# Patient Record
Sex: Male | Born: 1970 | Race: Black or African American | Hispanic: No | Marital: Married | Smoking: Never smoker
Health system: Southern US, Community
[De-identification: ages and names within clinical notes are randomized; demographics above are authoritative.]

---

## 2012-04-02 ENCOUNTER — Ambulatory Visit: Payer: Self-pay

## 2014-05-19 IMAGING — CR RIGHT HIP - COMPLETE 2+ VIEW
1 series · 2 of 2 positions shown · non-contrast
Comparison: none

REASON FOR EXAM: PAIN WITH ROM
COMMENTS:

PROCEDURE:     MDR - MDR HIP RIGHT COMPLETE  - April 02, 2012 [DATE]
RESULT:     No acute bony or joint abnormality. No evidence of fracture.
Mild degenerative change present.

[Series 1: ap · 0.17mm/px · 2 of 2 slices shown]
[im 1/2]
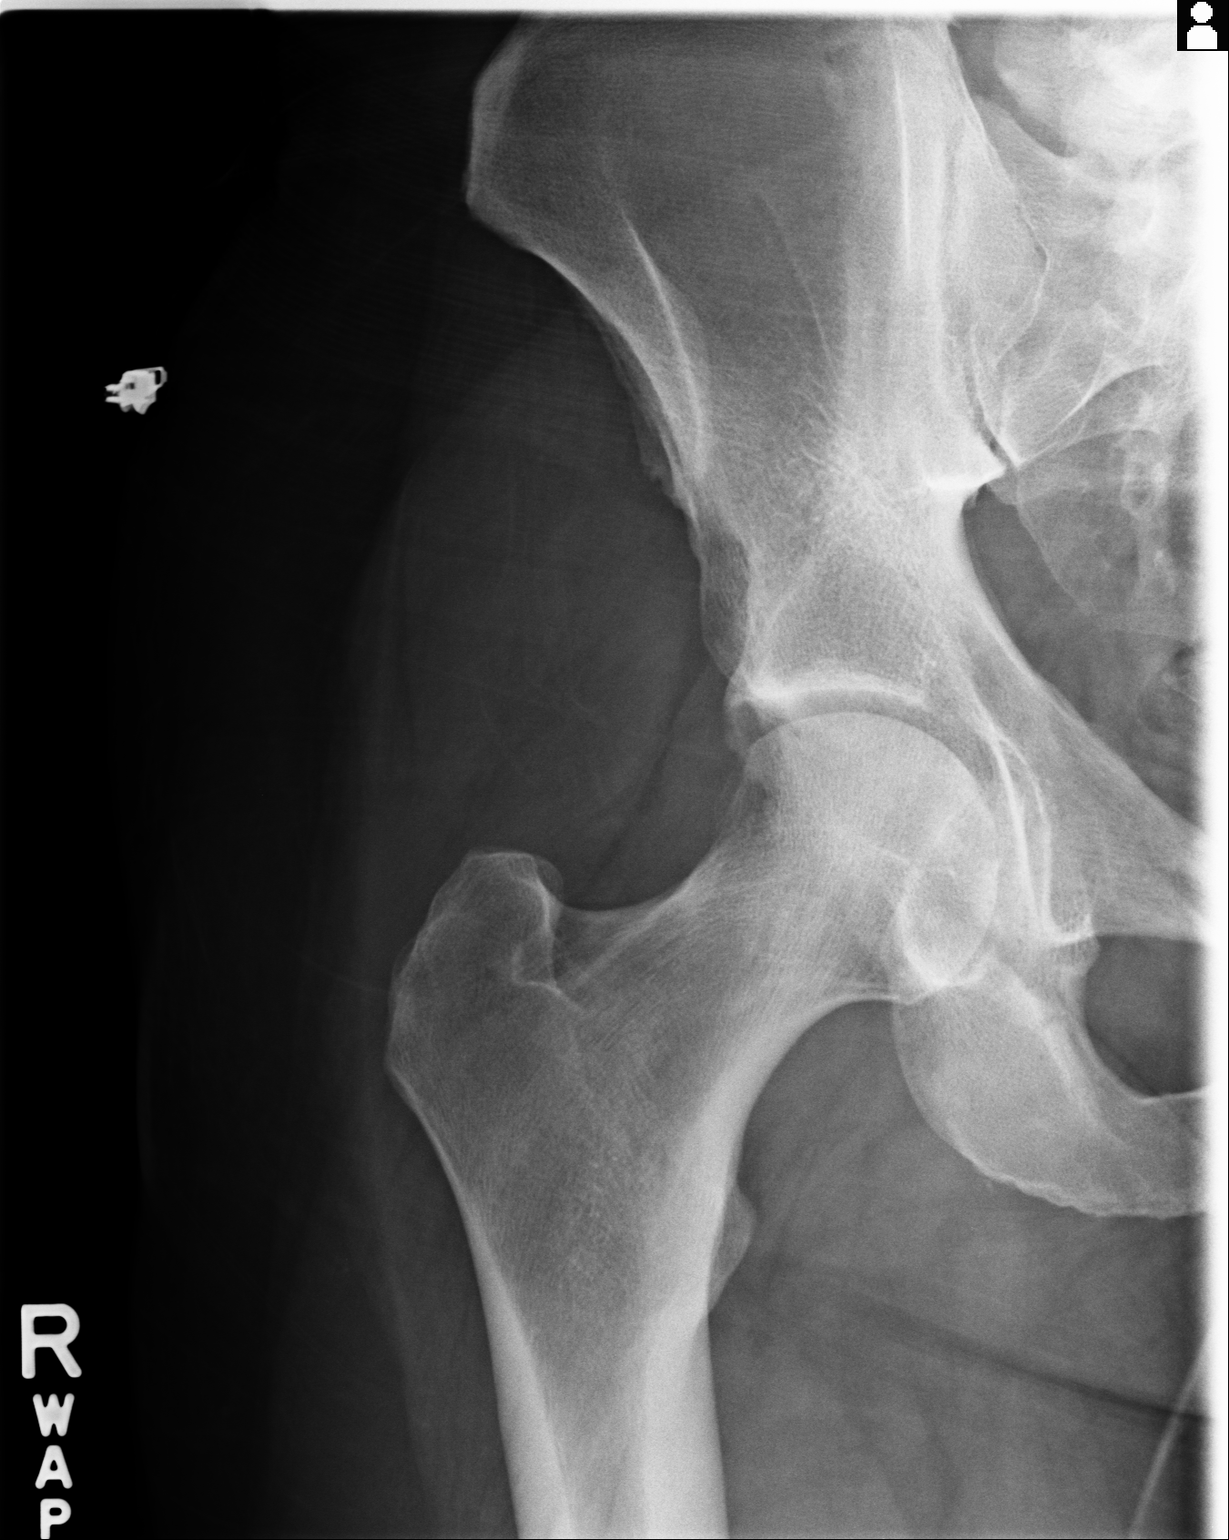
[im 2/2]
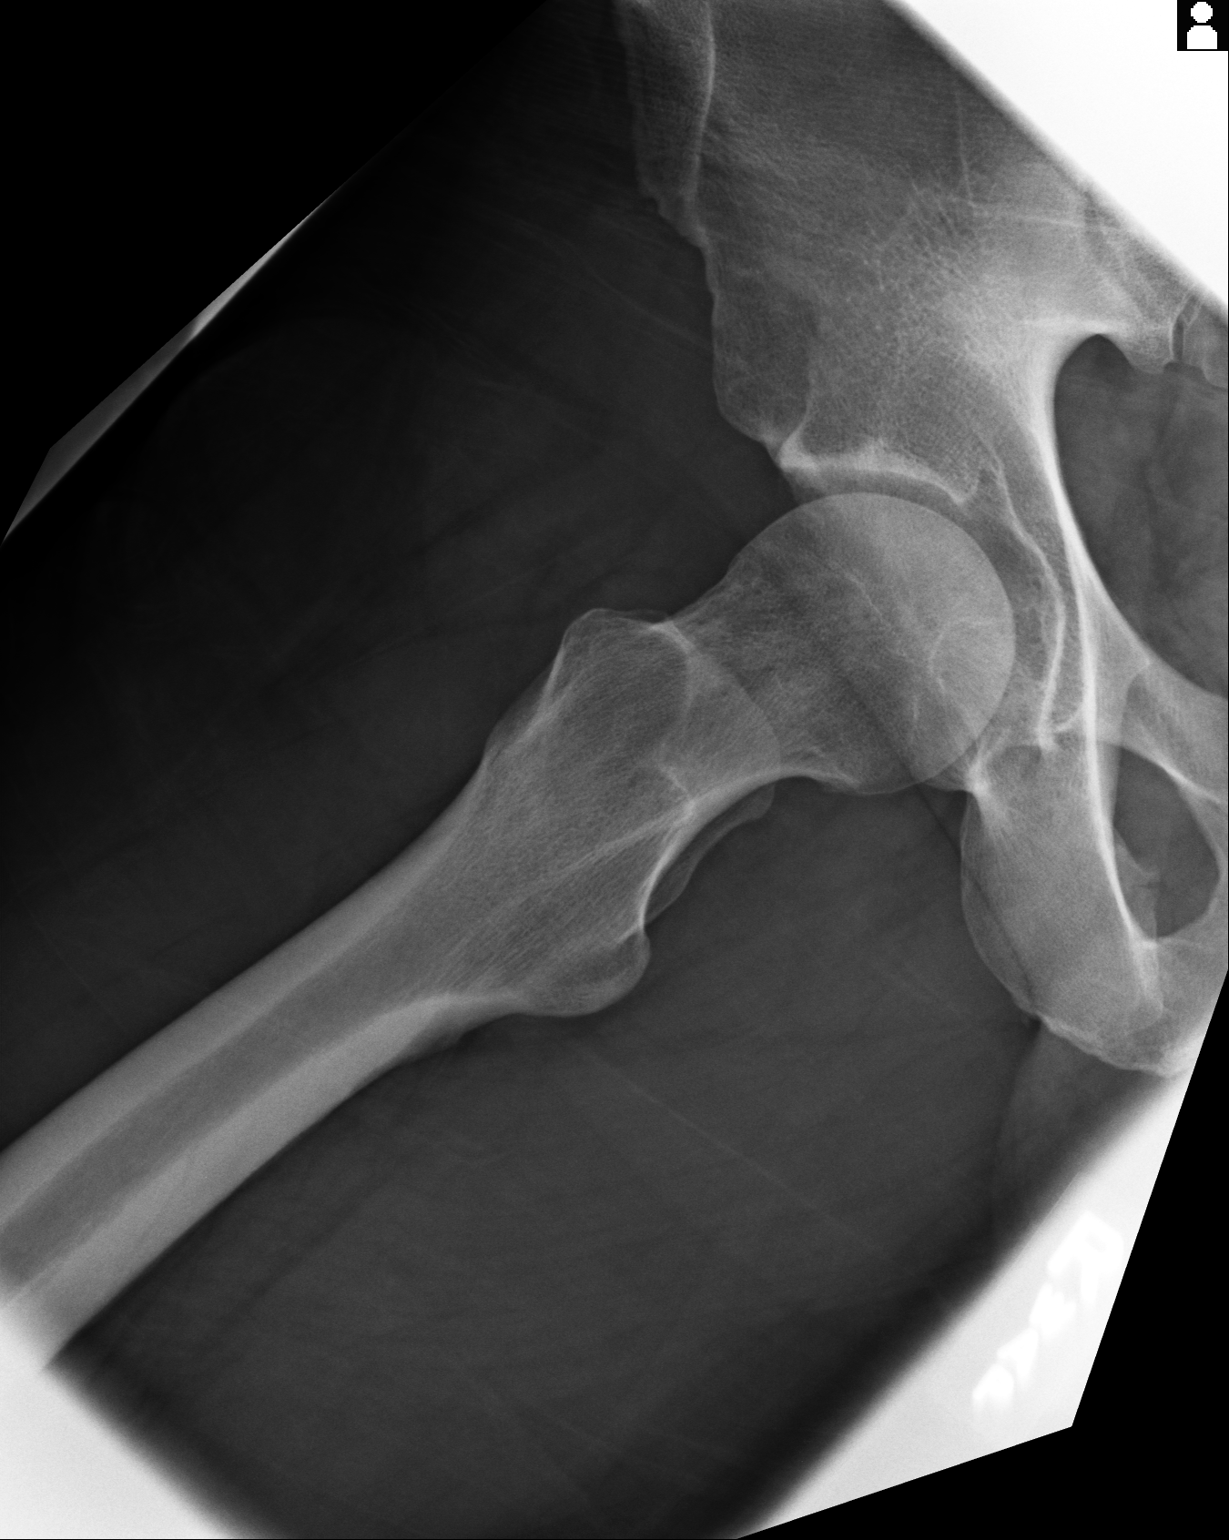

[2 of 2 positions shown; findings below may reference images not displayed]

IMPRESSION: No acute abnormality.

## 2016-01-21 ENCOUNTER — Encounter: Payer: Self-pay | Admitting: Emergency Medicine

## 2016-01-21 ENCOUNTER — Ambulatory Visit
Admission: EM | Admit: 2016-01-21 | Discharge: 2016-01-21 | Disposition: A | Discharge status: Home / Self Care | Payer: BLUE CROSS/BLUE SHIELD | Attending: Emergency Medicine | Admitting: Emergency Medicine

## 2016-01-21 DIAGNOSIS — K0889 Other specified disorders of teeth and supporting structures: Secondary | ICD-10-CM | POA: Diagnosis not present

## 2016-01-21 DIAGNOSIS — K047 Periapical abscess without sinus: Secondary | ICD-10-CM

## 2016-01-21 MED ORDER — PENICILLIN V POTASSIUM 500 MG PO TABS: 500.0000 mg | ORAL_TABLET | Freq: Four times a day (QID) | ORAL | 0 refills | Status: AC

## 2016-01-21 NOTE — ED Triage Notes (Signed)
Patient states that a crown in his bottom tooth came out and c/o swelling and pain that started this weekend.

## 2016-01-21 NOTE — ED Provider Notes (Signed)
[facility] ____________________________________________  Time seen: Approximately 3:49 PM  I have reviewed the triage vital signs and the nursing notes.   HISTORY  Chief Complaint Dental Pain    HPI Ernestina PennaBarnabas Vlcek is a 46 y.o. male presenting for the complaint of left lower dental pain and swelling. Patient reports he had a crown that had detached and his dentist had applied a temporary crown. Patient reports that this is a few weeks ago. Patient reports last week he was eating something and the temporary crown became loose and mobile. Patient reports he has had some intermittent pain with gradual onset of swelling with swelling that had increased over the last 2-3 days. Patient states pain is mild and aching. Patient reports that he will call his dentist tomorrow to schedule follow-up but concern for infection. Denies any fevers, drainage, difficulty swallowing. Reports continues to be able to open his mouth fully. Reports continues to eat and drink well but has to chew on the right side. Patient reports that otherwise he feels well.  Denies fevers, nausea, vomiting, diarrhea, chest pain or shortness of breath, rash or recent sickness. Denies recent antibiotic use.    History reviewed. No pertinent past medical history.  There are no active problems to display for this patient.   History reviewed. No pertinent surgical history.    Allergies Patient has no known allergies.  History reviewed. No pertinent family history.  Social History Social History  Substance Use Topics  . Smoking status: Never Smoker  . Smokeless tobacco: Never Used  . Alcohol use No    Review of Systems Constitutional: No fever/chills. Reports continues to eat and drink foods and fluids well.  Eyes: No visual changes. ENT: No sore throat.As above. Cardiovascular: Denies chest pain. Respiratory: Denies shortness of breath. Gastrointestinal: No abdominal pain.  No nausea, no  vomiting.  Genitourinary: Negative for dysuria. Musculoskeletal: Negative for back pain. Skin: Negative for rash. Neurological: Negative for headaches, focal weakness or numbness. 10-point ROS otherwise negative.  ____________________________________________   PHYSICAL EXAM:  VITAL SIGNS: ED Triage Vitals  Enc Vitals Group     BP 01/21/16 1537 (!) 129/99     Pulse Rate 01/21/16 1537 65     Resp 01/21/16 1537 16     Temp 01/21/16 1537 97.7 F (36.5 C)     Temp Source 01/21/16 1537 Oral     SpO2 01/21/16 1537 100 %     Weight 01/21/16 1535 218 lb (98.9 kg)     Height 01/21/16 1535 6' (1.829 m)     Head Circumference --      Peak Flow --      Pain Score 01/21/16 1537 3     Pain Loc --      Pain Edu? --      Excl. in GC? --     Constitutional: Alert and oriented. Well appearing and in no acute distress. Eyes: Conjunctivae are normal. PERRL. EOMI. Head: Atraumatic. Ears: Bilateral ears no erythema, normal TMs.  Nose: No congestion/rhinnorhea. Mouth/Throat: Mucous membranes are moist.  Oropharynx non-erythematous. Periodontal Exam     Loose filling noted to tooth #19, see image. Mild tenderness to palpation along gumline at tooth #19 with mild erythema, mild swelling, no fluctuance, no point in abscess, not otherwise nontender to palpation.   Neck: No stridor.  Hematological/Lymphatic/Immunilogical: No cervical lymphadenopathy. Cardiovascular:   Normal rate, regular rhythm. Grossly normal heart sounds. Good peripheral circulation. Respiratory:Normal respiratory effort.  No retractions.No wheezes, rales or rhonchi.  Musculoskeletal: No midline cervical, thoracic or lumbar tenderness to palpation.  Neurologic:  Normal speech and language. Speech is normal. No gait instability. Skin:  Skin is warm, dry and intact. No rash noted. Psychiatric: Mood and affect are normal. Speech and behavior are normal.  ____________________________________________   LABS (all labs ordered  are listed, but only abnormal results are displayed)  Labs Reviewed - No data to display ____________________________________________   INITIAL IMPRESSION / ASSESSMENT AND PLAN / ED COURSE  Pertinent labs & imaging results that were available during my care of the patient were reviewed by me and considered in my medical decision making (see chart for details).  Well-appearing patient. No acute distress. Presents with complaints of gradual onset of left lower tenderness and swelling around recent loose temporary crown. Suspect dental infection. No palpable or visualized abscess present. Patient reports continues to eat and drink well. Discussed need to follow-up closely with dentist. Will start patient on oral Pen-VK. Encouraged supportive care, rest, fluids, over-the-counter Tylenol as needed. Encouraged soft food diet. Discussed indication, risks and benefits of medications with patient.  Patient was advised to see the dentist within 10 days. Also advised to take the antibiotic until finished. Instructed to return to the Urgent Care or ER  for symptoms that change or worsen or if unable to schedule an appointment. ____________________________________________   FINAL CLINICAL IMPRESSION(S) / ED DIAGNOSES  Final diagnoses:  Pain, dental  Dental infection    Clinical Course       Renford Dills, NP 01/21/16 1716    Renford Dills, NP 01/21/16 1717

## 2016-01-21 NOTE — Discharge Instructions (Signed)
Take medication as prescribed. Rest. Drink plenty of fluids. Soft food diet.  Follow up with dentist this week as discussed. Call tomorrow.  Follow up with your primary care physician this week as needed. Return to Urgent care for new or worsening concerns.

## 2018-09-25 ENCOUNTER — Other Ambulatory Visit: Payer: Self-pay

## 2018-09-25 DIAGNOSIS — Z20822 Contact with and (suspected) exposure to covid-19: Secondary | ICD-10-CM

## 2018-09-27 LAB — NOVEL CORONAVIRUS, NAA: SARS-CoV-2, NAA: NOT DETECTED

## 2019-03-19 DIAGNOSIS — Z23 Encounter for immunization: Secondary | ICD-10-CM | POA: Diagnosis not present

## 2019-04-15 DIAGNOSIS — Z23 Encounter for immunization: Secondary | ICD-10-CM | POA: Diagnosis not present

## 2020-05-10 DIAGNOSIS — E663 Overweight: Secondary | ICD-10-CM | POA: Diagnosis not present

## 2020-05-10 DIAGNOSIS — R030 Elevated blood-pressure reading, without diagnosis of hypertension: Secondary | ICD-10-CM | POA: Diagnosis not present

## 2020-05-10 DIAGNOSIS — M436 Torticollis: Secondary | ICD-10-CM | POA: Diagnosis not present

## 2020-05-25 DIAGNOSIS — Z1211 Encounter for screening for malignant neoplasm of colon: Secondary | ICD-10-CM | POA: Diagnosis not present

## 2020-05-26 DIAGNOSIS — Z125 Encounter for screening for malignant neoplasm of prostate: Secondary | ICD-10-CM | POA: Diagnosis not present

## 2020-05-26 DIAGNOSIS — Z0000 Encounter for general adult medical examination without abnormal findings: Secondary | ICD-10-CM | POA: Diagnosis not present

## 2020-05-26 DIAGNOSIS — Z136 Encounter for screening for cardiovascular disorders: Secondary | ICD-10-CM | POA: Diagnosis not present

## 2020-07-10 DIAGNOSIS — Z0001 Encounter for general adult medical examination with abnormal findings: Secondary | ICD-10-CM | POA: Diagnosis not present

## 2020-07-10 DIAGNOSIS — Z1211 Encounter for screening for malignant neoplasm of colon: Secondary | ICD-10-CM | POA: Diagnosis not present

## 2020-07-10 DIAGNOSIS — Z136 Encounter for screening for cardiovascular disorders: Secondary | ICD-10-CM | POA: Diagnosis not present

## 2020-07-10 DIAGNOSIS — Z23 Encounter for immunization: Secondary | ICD-10-CM | POA: Diagnosis not present

## 2020-07-12 ENCOUNTER — Other Ambulatory Visit: Payer: Self-pay

## 2020-07-12 ENCOUNTER — Ambulatory Visit
Admission: RE | Admit: 2020-07-12 | Discharge: 2020-07-12 | Disposition: A | Discharge status: Home / Self Care | Payer: BC Managed Care – PPO | Source: Ambulatory Visit | Attending: Family Medicine | Admitting: Family Medicine

## 2020-07-12 DIAGNOSIS — K635 Polyp of colon: Secondary | ICD-10-CM | POA: Diagnosis not present

## 2020-07-12 DIAGNOSIS — D124 Benign neoplasm of descending colon: Secondary | ICD-10-CM | POA: Diagnosis not present

## 2020-07-12 DIAGNOSIS — Z1211 Encounter for screening for malignant neoplasm of colon: Secondary | ICD-10-CM | POA: Diagnosis not present

## 2020-07-12 DIAGNOSIS — Z1152 Encounter for screening for COVID-19: Secondary | ICD-10-CM

## 2020-07-12 DIAGNOSIS — D125 Benign neoplasm of sigmoid colon: Secondary | ICD-10-CM | POA: Diagnosis not present

## 2020-07-12 NOTE — ED Triage Notes (Signed)
Covid test for travel  

## 2020-07-13 LAB — NOVEL CORONAVIRUS, NAA: SARS-CoV-2, NAA: NOT DETECTED

## 2020-07-13 LAB — SARS-COV-2, NAA 2 DAY TAT

## 2020-07-25 DIAGNOSIS — R718 Other abnormality of red blood cells: Secondary | ICD-10-CM | POA: Diagnosis not present

## 2020-07-26 DIAGNOSIS — R051 Acute cough: Secondary | ICD-10-CM | POA: Diagnosis not present

## 2020-07-26 DIAGNOSIS — U071 COVID-19: Secondary | ICD-10-CM | POA: Diagnosis not present

## 2021-07-09 DIAGNOSIS — Z131 Encounter for screening for diabetes mellitus: Secondary | ICD-10-CM | POA: Diagnosis not present

## 2021-07-09 DIAGNOSIS — Z136 Encounter for screening for cardiovascular disorders: Secondary | ICD-10-CM | POA: Diagnosis not present

## 2021-07-09 DIAGNOSIS — Z23 Encounter for immunization: Secondary | ICD-10-CM | POA: Diagnosis not present

## 2021-07-09 DIAGNOSIS — Z1211 Encounter for screening for malignant neoplasm of colon: Secondary | ICD-10-CM | POA: Diagnosis not present

## 2021-07-09 DIAGNOSIS — R809 Proteinuria, unspecified: Secondary | ICD-10-CM | POA: Diagnosis not present

## 2021-07-09 DIAGNOSIS — Z0001 Encounter for general adult medical examination with abnormal findings: Secondary | ICD-10-CM | POA: Diagnosis not present

## 2021-07-16 DIAGNOSIS — Z136 Encounter for screening for cardiovascular disorders: Secondary | ICD-10-CM | POA: Diagnosis not present

## 2021-07-16 DIAGNOSIS — Z131 Encounter for screening for diabetes mellitus: Secondary | ICD-10-CM | POA: Diagnosis not present

## 2021-07-16 DIAGNOSIS — Z125 Encounter for screening for malignant neoplasm of prostate: Secondary | ICD-10-CM | POA: Diagnosis not present

## 2021-07-16 DIAGNOSIS — Z0001 Encounter for general adult medical examination with abnormal findings: Secondary | ICD-10-CM | POA: Diagnosis not present

## 2022-06-28 DIAGNOSIS — Z13220 Encounter for screening for lipoid disorders: Secondary | ICD-10-CM | POA: Diagnosis not present

## 2022-06-28 DIAGNOSIS — R7303 Prediabetes: Secondary | ICD-10-CM | POA: Diagnosis not present

## 2022-06-28 DIAGNOSIS — Z23 Encounter for immunization: Secondary | ICD-10-CM | POA: Diagnosis not present

## 2022-06-28 DIAGNOSIS — Z1211 Encounter for screening for malignant neoplasm of colon: Secondary | ICD-10-CM | POA: Diagnosis not present

## 2022-06-28 DIAGNOSIS — Z0001 Encounter for general adult medical examination with abnormal findings: Secondary | ICD-10-CM | POA: Diagnosis not present

## 2022-06-28 DIAGNOSIS — Z125 Encounter for screening for malignant neoplasm of prostate: Secondary | ICD-10-CM | POA: Diagnosis not present

## 2022-07-16 DIAGNOSIS — Z13220 Encounter for screening for lipoid disorders: Secondary | ICD-10-CM | POA: Diagnosis not present

## 2023-11-18 DIAGNOSIS — Z0001 Encounter for general adult medical examination with abnormal findings: Secondary | ICD-10-CM | POA: Diagnosis not present

## 2023-11-18 DIAGNOSIS — Z1159 Encounter for screening for other viral diseases: Secondary | ICD-10-CM | POA: Diagnosis not present

## 2023-11-18 DIAGNOSIS — Z13220 Encounter for screening for lipoid disorders: Secondary | ICD-10-CM | POA: Diagnosis not present

## 2023-11-18 DIAGNOSIS — R7303 Prediabetes: Secondary | ICD-10-CM | POA: Diagnosis not present

## 2023-11-26 DIAGNOSIS — Z125 Encounter for screening for malignant neoplasm of prostate: Secondary | ICD-10-CM | POA: Diagnosis not present

## 2023-11-26 DIAGNOSIS — D560 Alpha thalassemia: Secondary | ICD-10-CM | POA: Diagnosis not present

## 2023-11-26 DIAGNOSIS — R7303 Prediabetes: Secondary | ICD-10-CM | POA: Diagnosis not present

## 2023-11-26 DIAGNOSIS — Z23 Encounter for immunization: Secondary | ICD-10-CM | POA: Diagnosis not present

## 2023-11-26 DIAGNOSIS — R001 Bradycardia, unspecified: Secondary | ICD-10-CM | POA: Diagnosis not present

## 2023-11-26 DIAGNOSIS — Z1211 Encounter for screening for malignant neoplasm of colon: Secondary | ICD-10-CM | POA: Diagnosis not present

## 2023-11-26 DIAGNOSIS — Z0001 Encounter for general adult medical examination with abnormal findings: Secondary | ICD-10-CM | POA: Diagnosis not present
